# Patient Record
Sex: Female | Born: 1980 | Race: White | Hispanic: No | Marital: Married | Smoking: Current every day smoker
Health system: Southern US, Community
[De-identification: ages and names within clinical notes are randomized; demographics above are authoritative.]

## PROBLEM LIST (undated history)

## (undated) HISTORY — PX: TONSILLECTOMY: SUR1361

## (undated) HISTORY — PX: ABDOMINAL HYSTERECTOMY: SHX81

---

## 2007-11-20 ENCOUNTER — Emergency Department (HOSPITAL_COMMUNITY): Admission: EM | Admit: 2007-11-20 | Discharge: 2007-11-20 | Payer: Self-pay | Admitting: Emergency Medicine

## 2016-09-12 ENCOUNTER — Encounter (HOSPITAL_BASED_OUTPATIENT_CLINIC_OR_DEPARTMENT_OTHER): Payer: Self-pay | Admitting: Emergency Medicine

## 2016-09-12 ENCOUNTER — Emergency Department (HOSPITAL_BASED_OUTPATIENT_CLINIC_OR_DEPARTMENT_OTHER): Payer: BLUE CROSS/BLUE SHIELD

## 2016-09-12 ENCOUNTER — Emergency Department (HOSPITAL_BASED_OUTPATIENT_CLINIC_OR_DEPARTMENT_OTHER)
Admission: EM | Admit: 2016-09-12 | Discharge: 2016-09-12 | Disposition: A | Discharge status: Home / Self Care | Payer: BLUE CROSS/BLUE SHIELD | Attending: Emergency Medicine | Admitting: Emergency Medicine

## 2016-09-12 DIAGNOSIS — R0981 Nasal congestion: Secondary | ICD-10-CM | POA: Diagnosis present

## 2016-09-12 DIAGNOSIS — J40 Bronchitis, not specified as acute or chronic: Secondary | ICD-10-CM | POA: Insufficient documentation

## 2016-09-12 DIAGNOSIS — J069 Acute upper respiratory infection, unspecified: Secondary | ICD-10-CM | POA: Insufficient documentation

## 2016-09-12 DIAGNOSIS — J0110 Acute frontal sinusitis, unspecified: Secondary | ICD-10-CM

## 2016-09-12 DIAGNOSIS — J219 Acute bronchiolitis, unspecified: Secondary | ICD-10-CM | POA: Insufficient documentation

## 2016-09-12 DIAGNOSIS — F17200 Nicotine dependence, unspecified, uncomplicated: Secondary | ICD-10-CM | POA: Diagnosis not present

## 2016-09-12 DIAGNOSIS — J209 Acute bronchitis, unspecified: Secondary | ICD-10-CM

## 2016-09-12 LAB — RAPID STREP SCREEN (MED CTR MEBANE ONLY): Streptococcus, Group A Screen (Direct): NEGATIVE

## 2016-09-12 MED ORDER — PREDNISONE 20 MG PO TABS: ORAL_TABLET | ORAL | 0 refills | Status: AC

## 2016-09-12 MED ORDER — BENZONATATE 100 MG PO CAPS: 100.0000 mg | ORAL_CAPSULE | Freq: Three times a day (TID) | ORAL | 0 refills | Status: AC | PRN

## 2016-09-12 MED ORDER — ALBUTEROL SULFATE (2.5 MG/3ML) 0.083% IN NEBU
5.0000 mg | INHALATION_SOLUTION | Freq: Once | RESPIRATORY_TRACT | Status: AC
  Administered 2016-09-12: 5 mg via RESPIRATORY_TRACT
  Filled 2016-09-12: qty 6

## 2016-09-12 MED ORDER — LORATADINE 10 MG PO TABS
10.0000 mg | ORAL_TABLET | Freq: Once | ORAL | Status: AC
  Administered 2016-09-12: 10 mg via ORAL
  Filled 2016-09-12: qty 1

## 2016-09-12 MED ORDER — IBUPROFEN 400 MG PO TABS
600.0000 mg | ORAL_TABLET | Freq: Once | ORAL | Status: AC
  Administered 2016-09-12: 600 mg via ORAL
  Filled 2016-09-12: qty 1

## 2016-09-12 MED ORDER — ALBUTEROL SULFATE HFA 108 (90 BASE) MCG/ACT IN AERS
1.0000 | INHALATION_SPRAY | RESPIRATORY_TRACT | Status: DC | PRN
  Administered 2016-09-12: 1 via RESPIRATORY_TRACT
  Filled 2016-09-12 (×2): qty 6.7

## 2016-09-12 MED ORDER — OXYMETAZOLINE HCL 0.05 % NA SOLN: 1.0000 | Freq: Two times a day (BID) | NASAL | 0 refills | Status: AC

## 2016-09-12 MED ORDER — IBUPROFEN 600 MG PO TABS: 600.0000 mg | ORAL_TABLET | Freq: Four times a day (QID) | ORAL | 0 refills | Status: AC | PRN

## 2016-09-12 MED ORDER — PREDNISONE 50 MG PO TABS
60.0000 mg | ORAL_TABLET | Freq: Once | ORAL | Status: AC
  Administered 2016-09-12: 60 mg via ORAL
  Filled 2016-09-12: qty 1

## 2016-09-12 MED ORDER — MOMETASONE FUROATE 50 MCG/ACT NA SUSP: 2.0000 | Freq: Every day | NASAL | 12 refills | Status: AC

## 2016-09-12 NOTE — ED Provider Notes (Signed)
[facility]   CSN: [id number] Arrival date & time: 09/12/16  1041     History   Chief Complaint Chief Complaint  Patient presents with  . URI    HPI Kaiyla Stahly is a 35 y.o. female.  HPI Patient presents with 3 days of sinus congestion, facial pain, sore throat, nasal congestion, cough productive of green sputum, shortness of breath, diffuse myalgias and subjective fevers and chills. States she's been taking over-the-counter medication without relief. No sick contacts. No new lower extremity swelling or pain. No past medical history on file.  There are no active problems to display for this patient.   Past Surgical History:  Procedure Laterality Date  . ABDOMINAL HYSTERECTOMY    . TONSILLECTOMY    . VAGINAL DELIVERY      OB History    No data available       Home Medications    Prior to Admission medications   Medication Sig Start Date End Date Taking? Authorizing Provider  benzonatate (TESSALON) 100 MG capsule Take 1 capsule (100 mg total) by mouth 3 (three) times daily as needed for cough. 09/12/16   Loren Racer, MD  ibuprofen (ADVIL,MOTRIN) 600 MG tablet Take 1 tablet (600 mg total) by mouth every 6 (six) hours as needed for fever or moderate pain. 09/12/16   Loren Racer, MD  mometasone (NASONEX) 50 MCG/ACT nasal spray Place 2 sprays into the nose daily. 09/12/16   Loren Racer, MD  oxymetazoline (AFRIN NASAL SPRAY) 0.05 % nasal spray Place 1 spray into both nostrils 2 (two) times daily. Do not use more than 3 days. 09/12/16   Loren Racer, MD  predniSONE (DELTASONE) 20 MG tablet 3 tabs po day one, then 2 tabs daily x 4 days 09/13/16   Loren Racer, MD    Family History No family history on file.  Social History Social History  Substance Use Topics  . Smoking status: Current Every Day Smoker  . Smokeless tobacco: Never Used  . Alcohol use Not on file     Allergies   Penicillins   Review of Systems Review of  Systems  Constitutional: Positive for chills, fatigue and fever.  HENT: Positive for congestion, sinus pressure and sore throat. Negative for trouble swallowing.   Respiratory: Positive for cough, shortness of breath and wheezing.   Cardiovascular: Negative for chest pain, palpitations and leg swelling.  Gastrointestinal: Negative for abdominal pain, constipation, diarrhea, nausea and vomiting.  Genitourinary: Negative for dysuria, flank pain, frequency and hematuria.  Musculoskeletal: Positive for arthralgias, myalgias and neck pain. Negative for neck stiffness.  Skin: Negative for rash and wound.  Neurological: Positive for weakness (generalized) and headaches. Negative for dizziness, light-headedness and numbness.  All other systems reviewed and are negative.    Physical Exam Updated Vital Signs BP 118/67 (BP Location: Left Arm)   Pulse 68   Temp 98.4 F (36.9 C) (Oral)   Resp 16   Ht 5\' 5"  (1.651 m)   Wt 180 lb (81.6 kg)   SpO2 100%   BMI 29.95 kg/m   Physical Exam  Constitutional: She is oriented to person, place, and time. She appears well-developed and well-nourished. No distress.  HENT:  Head: Normocephalic and atraumatic.  Mouth/Throat: Oropharynx is clear and moist.  Oropharynx is erythematous, uvula is midline. No tonsillar exudates. Bilateral nasal mucosal edema. Tenderness to percussion over the left frontal and maxillary sinuses.  Eyes: EOM are normal. Pupils are equal, round, and reactive to light.  Neck:  Normal range of motion. Neck supple.  No meningismus. Patient does have tenderness to palpation over the right paracervical musculature.  Cardiovascular: Normal rate and regular rhythm.  Exam reveals no gallop and no friction rub.   No murmur heard. Pulmonary/Chest: Effort normal. She has no wheezes. She has no rales. She exhibits no tenderness.  Prolonged expiratory phase.  Abdominal: Soft. Bowel sounds are normal. There is no tenderness. There is no rebound  and no guarding.  Musculoskeletal: Normal range of motion. She exhibits no edema or tenderness.  No lower ext swelling, asymmetry or tenderness.  Lymphadenopathy:    She has no cervical adenopathy.  Neurological: She is alert and oriented to person, place, and time.  Moves all extremities without deficit. Sensation is fully intact.  Skin: Skin is warm and dry. Capillary refill takes less than 2 seconds. No rash noted. She is not diaphoretic. No erythema.  Psychiatric: She has a normal mood and affect. Her behavior is normal.  Nursing note and vitals reviewed.    ED Treatments / Results  Labs (all labs ordered are listed, but only abnormal results are displayed) Labs Reviewed  RAPID STREP SCREEN (NOT AT [facility])    EKG  EKG Interpretation None       Radiology Dg Chest 2 View  Result Date: 09/12/2016 CLINICAL DATA:  Pt haivng runny nose, fever, cough, generalized aches and pain since yesterday, smoker, no other complaints EXAM: CHEST  2 VIEW COMPARISON:  None. FINDINGS: Midline trachea.  Normal heart size and mediastinal contours. Sharp costophrenic angles.  No pneumothorax.  Clear lungs. IMPRESSION: No active cardiopulmonary disease. Electronically Signed   By: Jeronimo GreavesKyle  Talbot M.D.   On: 09/12/2016 11:32    Procedures Procedures (including critical care time)  Medications Ordered in ED Medications  albuterol (PROVENTIL HFA;VENTOLIN HFA) 108 (90 Base) MCG/ACT inhaler 1-2 puff (not administered)  albuterol (PROVENTIL) (2.5 MG/3ML) 0.083% nebulizer solution 5 mg (5 mg Nebulization Given 09/12/16 1158)  predniSONE (DELTASONE) tablet 60 mg (60 mg Oral Given 09/12/16 1135)  ibuprofen (ADVIL,MOTRIN) tablet 600 mg (600 mg Oral Given 09/12/16 1135)  loratadine (CLARITIN) tablet 10 mg (10 mg Oral Given 09/12/16 1134)     Initial Impression / Assessment and Plan / ED Course  I have reviewed the triage vital signs and the nursing notes.  Pertinent labs & imaging results that were available  during my care of the patient were reviewed by me and considered in my medical decision making (see chart for details).  Clinical Course   Improved air movement after nebulized treatment. Says she's feeling better. We'll discharge home with short course of prednisone, epidural inhaler, nasal sprays and patient is advised to continue taking her loratadine daily. Return precautions given.   Final Clinical Impressions(s) / ED Diagnoses   Final diagnoses:  URI (upper respiratory infection)  Acute frontal sinusitis, recurrence not specified  Bronchitis with bronchospasm    New Prescriptions New Prescriptions   BENZONATATE (TESSALON) 100 MG CAPSULE    Take 1 capsule (100 mg total) by mouth 3 (three) times daily as needed for cough.   IBUPROFEN (ADVIL,MOTRIN) 600 MG TABLET    Take 1 tablet (600 mg total) by mouth every 6 (six) hours as needed for fever or moderate pain.   MOMETASONE (NASONEX) 50 MCG/ACT NASAL SPRAY    Place 2 sprays into the nose daily.   OXYMETAZOLINE (AFRIN NASAL SPRAY) 0.05 % NASAL SPRAY    Place 1 spray into both nostrils 2 (two) times daily. Do not  use more than 3 days.   PREDNISONE (DELTASONE) 20 MG TABLET    3 tabs po day one, then 2 tabs daily x 4 days     Loren Racer, MD 09/12/16 1318

## 2016-09-12 NOTE — ED Triage Notes (Signed)
Pt haivng runny nose, fever, cough, generalized aches and pain yesterday.

## 2016-09-15 LAB — CULTURE, GROUP A STREP (THRC)

## 2016-09-25 ENCOUNTER — Ambulatory Visit: Payer: BLUE CROSS/BLUE SHIELD | Admitting: Family Medicine

## 2021-10-10 ENCOUNTER — Emergency Department (HOSPITAL_COMMUNITY)
Admission: EM | Admit: 2021-10-10 | Discharge: 2021-10-10 | Disposition: A | Discharge status: Home / Self Care | Payer: Self-pay | Attending: Emergency Medicine | Admitting: Emergency Medicine

## 2021-10-10 ENCOUNTER — Emergency Department (HOSPITAL_COMMUNITY): Payer: Self-pay

## 2021-10-10 ENCOUNTER — Encounter (HOSPITAL_COMMUNITY): Payer: Self-pay | Admitting: Emergency Medicine

## 2021-10-10 ENCOUNTER — Other Ambulatory Visit: Payer: Self-pay

## 2021-10-10 DIAGNOSIS — W231XXA Caught, crushed, jammed, or pinched between stationary objects, initial encounter: Secondary | ICD-10-CM | POA: Insufficient documentation

## 2021-10-10 DIAGNOSIS — F17200 Nicotine dependence, unspecified, uncomplicated: Secondary | ICD-10-CM | POA: Insufficient documentation

## 2021-10-10 DIAGNOSIS — S62623A Displaced fracture of medial phalanx of left middle finger, initial encounter for closed fracture: Secondary | ICD-10-CM | POA: Insufficient documentation

## 2021-10-10 DIAGNOSIS — S62603A Fracture of unspecified phalanx of left middle finger, initial encounter for closed fracture: Secondary | ICD-10-CM

## 2021-10-10 DIAGNOSIS — M79645 Pain in left finger(s): Secondary | ICD-10-CM | POA: Insufficient documentation

## 2021-10-10 MED ORDER — OXYCODONE HCL 5 MG PO TABS: 5.0000 mg | ORAL_TABLET | Freq: Three times a day (TID) | ORAL | 0 refills | Status: AC | PRN

## 2021-10-10 NOTE — ED Provider Notes (Signed)
[facility]-EMERGENCY DEPT Provider Note   CSN: [id number] Arrival date & time: 10/10/21  1400     History Chief Complaint  Patient presents with   Hand Pain    Jennifer Payes is a 40 y.o. female.  Patient injured her left middle finger today.  She grabbed a rail and it got caught up.  She states that it was the tip of her left middle finger and seemed to be pointing in a different direction and she was able to straighten it out.  The history is provided by the patient.  Hand Pain This is a new problem. The problem occurs constantly. The problem has not changed since onset.Exacerbated by: movement. Nothing relieves the symptoms. She has tried nothing for the symptoms. The treatment provided no relief.      History reviewed. No pertinent past medical history.  There are no problems to display for this patient.   Past Surgical History:  Procedure Laterality Date   ABDOMINAL HYSTERECTOMY     TONSILLECTOMY     VAGINAL DELIVERY       OB History   No obstetric history on file.     History reviewed. No pertinent family history.  Social History   Tobacco Use   Smoking status: Every Day   Smokeless tobacco: Never    Home Medications Prior to Admission medications   Medication Sig Start Date End Date Taking? Authorizing Provider  benzonatate (TESSALON) 100 MG capsule Take 1 capsule (100 mg total) by mouth 3 (three) times daily as needed for cough. 09/12/16   Loren Racer, MD  ibuprofen (ADVIL,MOTRIN) 600 MG tablet Take 1 tablet (600 mg total) by mouth every 6 (six) hours as needed for fever or moderate pain. 09/12/16   Loren Racer, MD  mometasone (NASONEX) 50 MCG/ACT nasal spray Place 2 sprays into the nose daily. 09/12/16   Loren Racer, MD  oxyCODONE (ROXICODONE) 5 MG immediate release tablet Take 1 tablet (5 mg total) by mouth every 8 (eight) hours as needed for up to 5 doses for breakthrough pain. 10/10/21  Yes Ashritha Desrosiers, DO   oxymetazoline (AFRIN NASAL SPRAY) 0.05 % nasal spray Place 1 spray into both nostrils 2 (two) times daily. Do not use more than 3 days. 09/12/16   Loren Racer, MD  predniSONE (DELTASONE) 20 MG tablet 3 tabs po day one, then 2 tabs daily x 4 days 09/13/16   Loren Racer, MD    Allergies    Penicillins  Review of Systems   Review of Systems  Musculoskeletal:  Positive for arthralgias.  Skin:  Positive for color change. Negative for wound.  Neurological:  Negative for weakness and numbness.   Physical Exam Updated Vital Signs  ED Triage Vitals  Enc Vitals Group     BP 10/10/21 1425 122/61     Pulse Rate 10/10/21 1425 80     Resp 10/10/21 1425 18     Temp 10/10/21 1425 99.5 F (37.5 C)     Temp Source 10/10/21 1425 Oral     SpO2 10/10/21 1425 98 %     Weight --      Height --      Head Circumference --      Peak Flow --      Pain Score 10/10/21 1421 5     Pain Loc --      Pain Edu? --      Excl. in GC? --      Physical Exam Constitutional:  General: She is not in acute distress.    Appearance: She is not ill-appearing.  Musculoskeletal:        General: Swelling and tenderness present.     Comments: Decreased range of movement to left middle finger secondary to pain, there is swelling and bruising throughout the left middle finger with tenderness  Skin:    Findings: Bruising present.  Neurological:     General: No focal deficit present.     Mental Status: She is alert.     Sensory: No sensory deficit.     Motor: No weakness.    ED Results / Procedures / Treatments   Labs (all labs ordered are listed, but only abnormal results are displayed) Labs Reviewed - No data to display  EKG None  Radiology DG Hand Complete Left  Result Date: 10/10/2021 CLINICAL DATA:  Fall, pain EXAM: LEFT HAND - COMPLETE 3+ VIEW COMPARISON:  None. FINDINGS: Acute oblique fracture through the middle phalanx of the third digit with minimal displacement. Joint spaces are  preserved. IMPRESSION: Acute minimally displaced oblique fracture of the middle phalanx shaft of the third digit. Electronically Signed   By: Guadlupe Spanish M.D.   On: 10/10/2021 15:09    Procedures Procedures   Medications Ordered in ED Medications - No data to display  ED Course  I have reviewed the triage vital signs and the nursing notes.  Pertinent labs & imaging results that were available during my care of the patient were reviewed by me and considered in my medical decision making (see chart for details).    MDM Rules/Calculators/A&P                           Nicholas Ossa is here with pain to her middle finger on the left.  Got her finger caught while trying to use a rail.  She has bruising and swelling to the left middle finger.  There is a minimally displaced oblique fracture through the middle phalanx shaft of the third digit.  Joint spaces are preserved.  Will place in a static splint and have her follow-up with hand.  Recommend ice, Tylenol, ibuprofen.  Will write short course of narcotic meds for breakthrough pain.  Overall she appears neurovascular neuromuscular intact.  This chart was dictated using voice recognition software.  Despite best efforts to proofread,  errors can occur which can change the documentation meaning.   Final Clinical Impression(s) / ED Diagnoses Final diagnoses:  Closed displaced fracture of phalanx of left middle finger, unspecified phalanx, initial encounter    Rx / DC Orders ED Discharge Orders          Ordered    oxyCODONE (ROXICODONE) 5 MG immediate release tablet  Every 8 hours PRN        10/10/21 1523             Ineze Serrao, DO 10/10/21 1525

## 2021-10-10 NOTE — Discharge Instructions (Addendum)
Follow-up with hand doctor, Dr. Izora Ribas.  Keep your finger in a splint but may remove for shower.  Recommend ice, Tylenol, ibuprofen, Roxicodone for breakthrough pain.  Oxycodone is a narcotic pain medicine, please do not mix with alcohol or other drugs.  Do not drive while taking this medicine.

## 2021-10-10 NOTE — ED Triage Notes (Signed)
The patient caught herself from falling this morning. Since she has noticed pain, swelling and difficulty with range of motion in the left hand.

## 2022-07-22 IMAGING — CR DG HAND COMPLETE 3+V*L*
3 series · 3 of 3 positions shown · non-contrast
Comparison: None.

CLINICAL DATA: Fall, pain

EXAM:
LEFT HAND - COMPLETE 3+ VIEW

[x hand pa left]
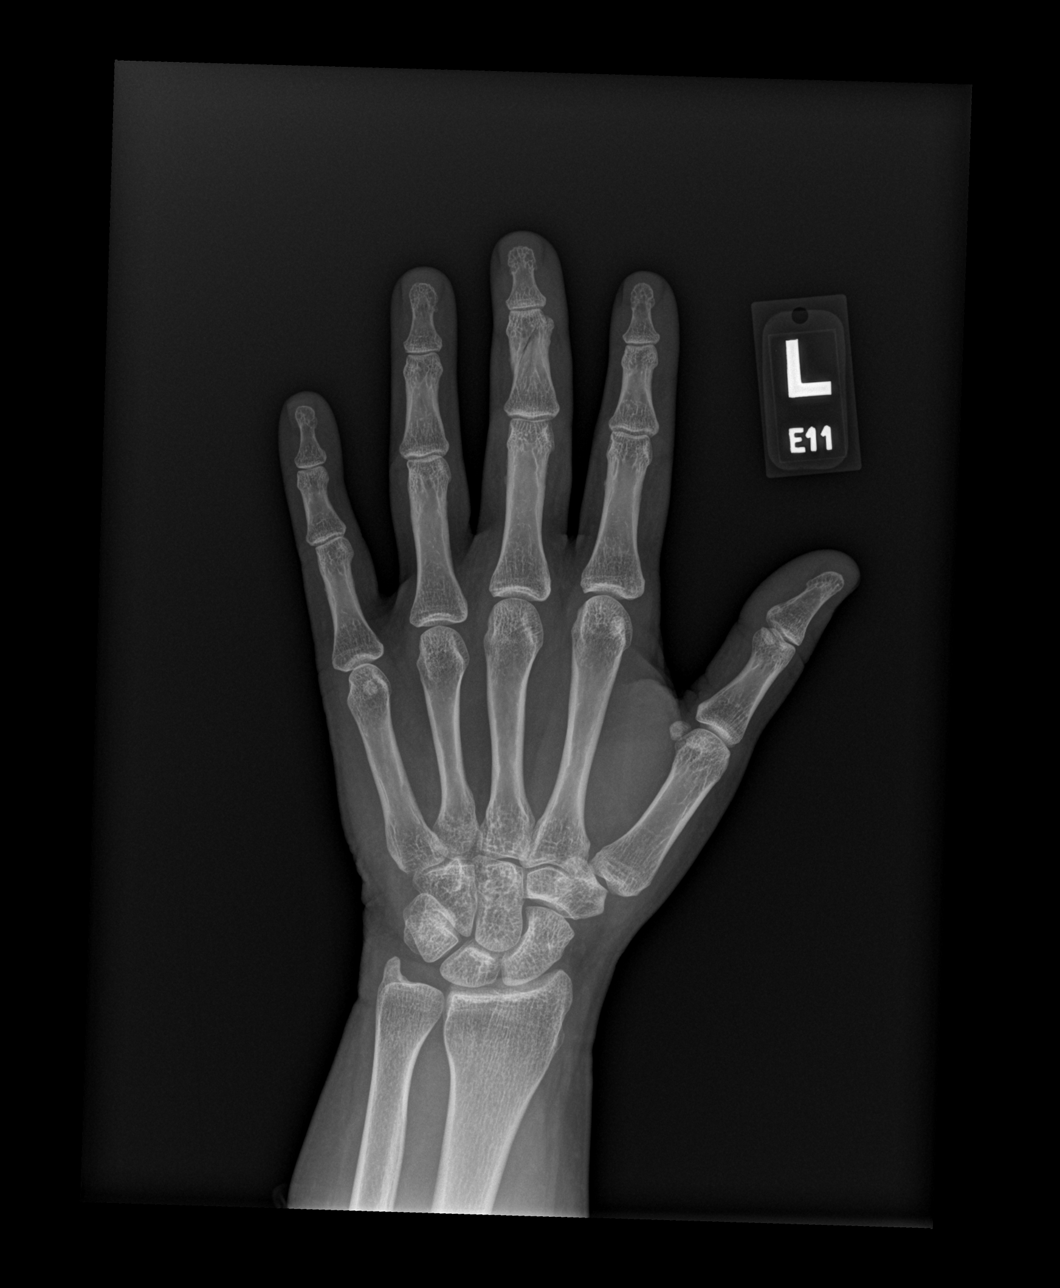

[x hand obl left]
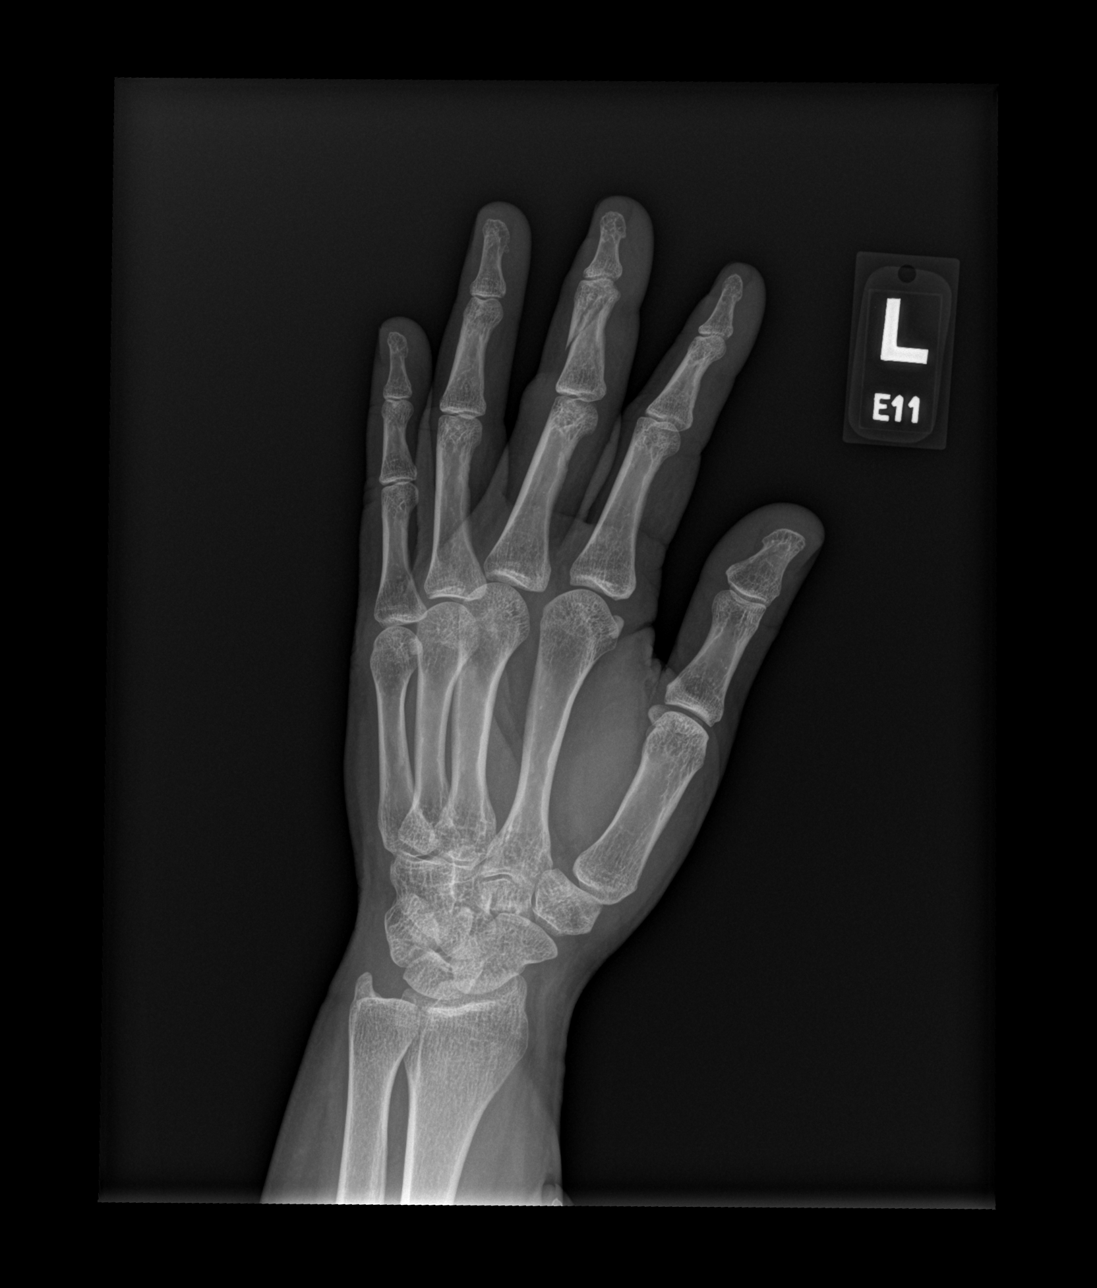

[x hand lat left]
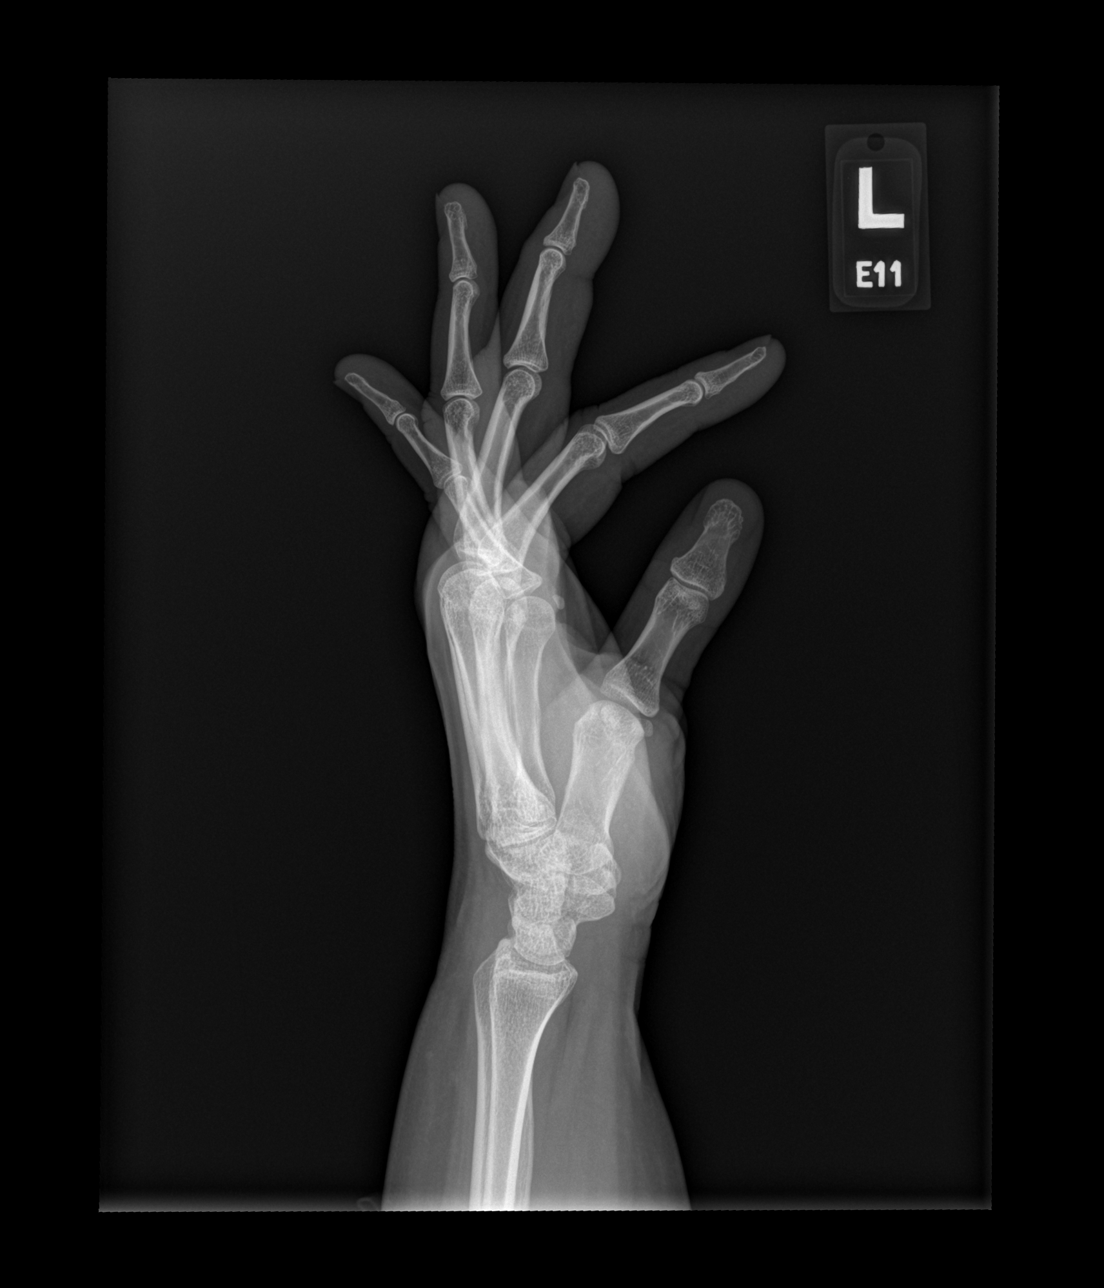

[3 of 3 positions shown; findings below may reference images not displayed]

FINDINGS: Acute oblique fracture through the middle phalanx of the third digit
with minimal displacement. Joint spaces are preserved.
IMPRESSION: Acute minimally displaced oblique fracture of the middle phalanx
shaft of the third digit.
